# Patient Record
Sex: Male | Born: 1955 | Race: Black or African American | Hispanic: No | Marital: Married | Smoking: Never smoker
Health system: Southern US, Community
[De-identification: ages and names within clinical notes are randomized; demographics above are authoritative.]

## PROBLEM LIST (undated history)

## (undated) DIAGNOSIS — I10 Essential (primary) hypertension: Secondary | ICD-10-CM

## (undated) DIAGNOSIS — E785 Hyperlipidemia, unspecified: Secondary | ICD-10-CM

## (undated) DIAGNOSIS — K602 Anal fissure, unspecified: Secondary | ICD-10-CM

## (undated) DIAGNOSIS — R42 Dizziness and giddiness: Secondary | ICD-10-CM

## (undated) DIAGNOSIS — M4800 Spinal stenosis, site unspecified: Secondary | ICD-10-CM

## (undated) HISTORY — DX: Spinal stenosis, site unspecified: M48.00

## (undated) HISTORY — DX: Dizziness and giddiness: R42

## (undated) HISTORY — DX: Anal fissure, unspecified: K60.2

## (undated) HISTORY — DX: Essential (primary) hypertension: I10

## (undated) HISTORY — DX: Hyperlipidemia, unspecified: E78.5

---

## 2000-07-12 ENCOUNTER — Encounter: Admission: RE | Admit: 2000-07-12 | Discharge: 2000-07-12 | Payer: Self-pay | Admitting: Family Medicine

## 2000-07-12 ENCOUNTER — Encounter: Payer: Self-pay | Admitting: Family Medicine

## 2004-01-13 ENCOUNTER — Ambulatory Visit: Admission: RE | Admit: 2004-01-13 | Discharge: 2004-01-13 | Payer: Self-pay | Admitting: Family Medicine

## 2004-05-06 ENCOUNTER — Ambulatory Visit (HOSPITAL_COMMUNITY): Admission: RE | Admit: 2004-05-06 | Discharge: 2004-05-06 | Payer: Self-pay | Admitting: Family Medicine

## 2014-08-23 LAB — TSH: TSH: 2.4 u[IU]/mL (ref <=5.90)

## 2014-08-27 ENCOUNTER — Other Ambulatory Visit: Payer: Self-pay | Admitting: Family Medicine

## 2014-08-27 DIAGNOSIS — E041 Nontoxic single thyroid nodule: Secondary | ICD-10-CM

## 2014-08-29 ENCOUNTER — Ambulatory Visit
Admission: RE | Admit: 2014-08-29 | Discharge: 2014-08-29 | Disposition: A | Discharge status: Home / Self Care | Payer: BC Managed Care – PPO | Source: Ambulatory Visit | Attending: Family Medicine | Admitting: Family Medicine

## 2014-08-29 DIAGNOSIS — E041 Nontoxic single thyroid nodule: Secondary | ICD-10-CM

## 2014-09-30 ENCOUNTER — Encounter: Payer: Self-pay | Admitting: Internal Medicine

## 2014-09-30 ENCOUNTER — Ambulatory Visit (INDEPENDENT_AMBULATORY_CARE_PROVIDER_SITE_OTHER): Payer: BC Managed Care – PPO | Admitting: Internal Medicine

## 2014-09-30 ENCOUNTER — Telehealth: Payer: Self-pay | Admitting: Internal Medicine

## 2014-09-30 VITALS — BP 114/62 | HR 86 | Temp 97.6°F | Resp 12 | Ht 69.0 in | Wt 186.0 lb

## 2014-09-30 DIAGNOSIS — R221 Localized swelling, mass and lump, neck: Secondary | ICD-10-CM

## 2014-09-30 DIAGNOSIS — E042 Nontoxic multinodular goiter: Secondary | ICD-10-CM

## 2014-09-30 NOTE — Patient Instructions (Addendum)
Please schedule the CT scan of the neck.  Please return in 2 years.

## 2014-09-30 NOTE — Progress Notes (Addendum)
Patient ID: Joel BaconJohn L Manninen, male   DOB: Apr 25, 1956, 59 y.o.   MRN: [id number]   HPI  Joel Paul is a 59 y.o.-year-old male, referred by his PCP, Dr. Nolene EbbsWilliam Randall Harris, for evaluation and management of thyroid nodules.  Patient describes that he first noticed a lump in R upper neck, near his Adam's apple, 2 months ago >> saw his PCP then and a neck ultrasound was ordered. This showed several small thyroid nodules, but no abnormality in the right upper neck. He still notices this mass that moves with swallowing, but he does not believe that he grew in the interval.  Thyroid U/S (08/29/2014): Small nodules, the largest being 7 mm  I reviewed pt's thyroid tests: 08/23/2014: TSH 2.40, fT4 1.39, TT3 111 (71-180)  Pt denies feeling nodules in neck other than the one mentioned above, also denies hoarseness, dysphagia/odynophagia, SOB with lying down.  Pt denies: - heat intolerance/cold intolerance - tremors - palpitations - anxiety/depression - hyperdefecation/constipation - weight loss - weight gain - dry skin - hair falling - problems with concentration - fatigue  Pt does have a FH of thyroid ds >> hypothyroidism in sister. No FH of thyroid cancer. No h/o radiation tx to head or neck.  No seaweed or kelp, no recent contrast studies. No steroid use. No herbal supplements.   I reviewed his chart and he also has a history of HTN, HL.  ROS: Constitutional: no weight gain/loss, no fatigue, no subjective hyperthermia/hypothermia Eyes: no blurry vision, no xerophthalmia ENT: no sore throat, + see HPI Cardiovascular: no CP/SOB/palpitations/leg swelling Respiratory: no cough/SOB Gastrointestinal: no N/V/D/C Musculoskeletal: no muscle/joint aches Skin: no rashes Neurological: no tremors/numbness/tingling/dizziness Psychiatric: no depression/anxiety  Past Medical History  Diagnosis Date  . Hypertension   . Spinal stenosis   . Vertigo   . Anal fissure   . Hyperlipidemia     No past surgical history on file.   History   Social History  . Marital Status: Married    Spouse Name: N/A  . Number of Children: 1   Occupational History  .  insurance sales    Social History Main Topics  . Smoking status: Never Smoker   . Smokeless tobacco: Not on file  . Alcohol Use: No  . Drug Use: No   Social History Narrative   Married   1 child: Daughter in college   Owner: [employer]      Current Outpatient Rx  Name  Route  Sig  Dispense  Refill  . CARDIZEM LA 240 MG 24 hr tablet   Oral   Take 240 mg by mouth daily.       3     Dispense as written.   . triamterene-hydrochlorothiazide (DYAZIDE) 37.5-25 MG per capsule   Oral   Take 1 capsule by mouth daily. 1 tablet in the morning          Allergies  Allergen Reactions  . Codeine Nausea And Vomiting    Fainting   . Fentanyl Other (See Comments)    GI upset, unable to eat   Family history:  Mother and brother with hypertension and hyperlipidemia. Sister with hypothyroidism.  PE: BP 114/62 mmHg  Pulse 86  Temp(Src) 97.6 F (36.4 C) (Oral)  Resp 12  Ht 5\' 9"  (1.753 m)  Wt 186 lb (84.369 kg)  BMI 27.45 kg/m2  SpO2 98% Wt Readings from Last 3 Encounters:  09/30/14 186 lb (84.369 kg)   Constitutional: overweight, in NAD Eyes:  PERRLA, EOMI, no exophthalmos ENT: moist mucous membranes, no thyromegaly, no cervical lymphadenopathy; palpable approximately 1.5 cm mass on the right side, close to his Adam's apple Cardiovascular: RRR, No MRG Respiratory: CTA B Gastrointestinal: abdomen soft, NT, ND, BS+ Musculoskeletal: no deformities, strength intact in all 4;  Skin: moist, warm, no rashes Neurological: no tremor with outstretched hands, DTR normal in all 4  ASSESSMENT: 1. Multiple thyroid nodules - thyroid U/S (08/29/2014): Small nodules, the largest being 7 mm. No lymphadenopathy. No abnormality seen in the upper right neck.  2. Right upper neck nodule  PLAN: 1. MNG   - I reviewed the images of his thyroid ultrasound along with the patient. I pointed out that the dominant nodules are very small, with 2 slightly larger, 6 in the largest dimension in the right lobe.  they are without calcifications, without internal blood flow, more wide than tall, and well delimited from surrounding tissue. Pt does not have a thyroid cancer family history or a personal history of RxTx to head/neck. All these would favor benignity.  - I explained that these nodules are low risk based on the size in their characteristics, however, the ones in the right lobe are solid and hypoechoic and I suggested that we reimage them in 2 years  - I advised pt to  call me before our next appointment so I can order the new ultrasound and will review the images when he comes back  2. Right upper neck nodule - He definitely has a palpable mass in the right upper neck, in close proximity to his Adam's apple. This moves freely, with deglutition.  - No abnormality was found on the ultrasound, so we decided to obtain a CT scan of his neck to further characterize. - He was previously a singer - No problems with swallowing, pain, or other neck symptoms   Orders Placed This Encounter  Procedures  . CT Soft Tissue Neck W Contrast   I will addend the results when they become available.  10/19/2014: CLINICAL DATA: Patient complains of a lump in the RIGHT upper neck noticed 2 months ago, which moves with swallowing but has not enlarged in the interval. No reported tenderness  EXAM: CT NECK WITH CONTRAST  TECHNIQUE: Multidetector CT imaging of the neck was performed using the standard protocol following the bolus administration of intravenous contrast.  CONTRAST: 75mL OMNIPAQUE IOHEXOL 300 MG/ML SOLN  COMPARISON: Thyroid ultrasound 08/29/2014.  FINDINGS: Pharynx and larynx: Negative. No evidence for laryngocele.  Salivary glands: Negative  Thyroid: Unremarkable. Tiny nodules  noted on previous thyroid sonography are better visualized on that prior imaging study. No concerning features. No overall gland enlargement.  Lymph nodes: Negative  Vascular: Negative  Limited intracranial: Negative  Visualized orbits: Negative  Mastoids and visualized paranasal sinuses: Negative  Skeleton: Negative except for spondylosis  Upper chest: Negative.  Attention was directed to the area of clinical concern, location of which marked with a vitamin-E capsule. This corresponds to slight asymmetry of the strap muscles. There is a 3 x 5 mm cross-section area of relative hypoattenuation as seen on image 55 series 3. No concerning features such is associated inflammation or underlying abnormality of the thyroid cartilage. No evidence for enhancing rim, and no similar abnormality on the LEFT. Clinical significance uncertain. ENT consultation may be warranted.  IMPRESSION: No thyroid abnormality is detected.  The area of clinical concern, marked with a vitamin-E capsule, corresponds to slight asymmetric enlargement of the RIGHT strap muscle. There is a 3  x 5 mm area of relative hypoattenuation without rim enhancement or surrounding concerning features. Significance is uncertain.   Electronically Signed  By: Davonna BellingJohn Curnes M.D.  On: 10/19/2014 10:32

## 2014-10-02 NOTE — Telephone Encounter (Signed)
Error

## 2014-10-07 ENCOUNTER — Encounter: Payer: Self-pay | Admitting: Internal Medicine

## 2014-10-18 ENCOUNTER — Ambulatory Visit
Admission: RE | Admit: 2014-10-18 | Discharge: 2014-10-18 | Disposition: A | Discharge status: Home / Self Care | Payer: BC Managed Care – PPO | Source: Ambulatory Visit | Attending: Internal Medicine | Admitting: Internal Medicine

## 2014-10-18 MED ORDER — IOHEXOL 300 MG/ML  SOLN
75.0000 mL | Freq: Once | INTRAMUSCULAR | Status: AC | PRN
  Administered 2014-10-18: 75 mL via INTRAVENOUS

## 2014-10-22 ENCOUNTER — Telehealth: Payer: Self-pay | Admitting: Internal Medicine

## 2014-10-22 NOTE — Telephone Encounter (Signed)
Patient is calling for the result of her scan

## 2014-10-22 NOTE — Telephone Encounter (Signed)
I sent you a result message.

## 2014-10-22 NOTE — Telephone Encounter (Signed)
Please advise the results of pt's scan.

## 2014-10-23 NOTE — Addendum Note (Signed)
Addended by: Carlus PavlovGHERGHE, Jentzen Minasyan on: 10/23/2014 04:57 PM   Modules accepted: Level of Service

## 2016-07-03 IMAGING — US US SOFT TISSUE HEAD/NECK
1 series · 14 of 25 positions shown · non-contrast
Comparison: None.

CLINICAL DATA: Thyroid nodule.

EXAM:
THYROID ULTRASOUND
TECHNIQUE: Ultrasound examination of the thyroid gland and adjacent soft
tissues was performed.

[Series 1: us soft tissue head/neck · 0.05mm/px · 14 of 51 slices shown]
[im 1/51]
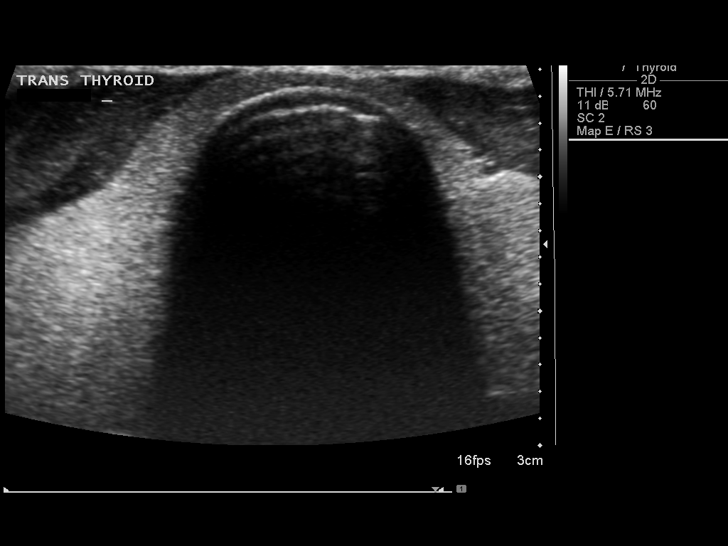
[im 5/51]
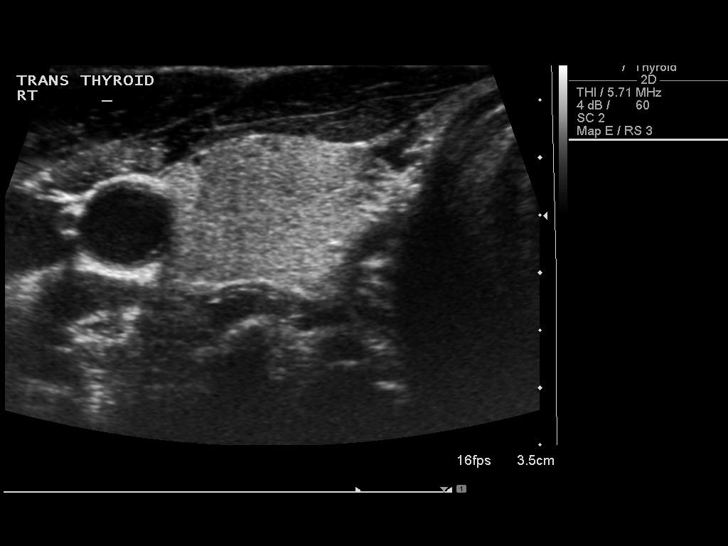
[im 9/51]
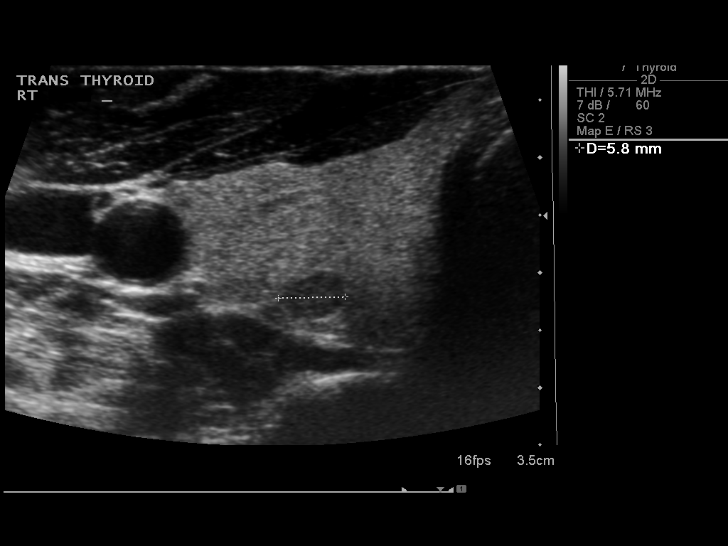
[im 13/51]
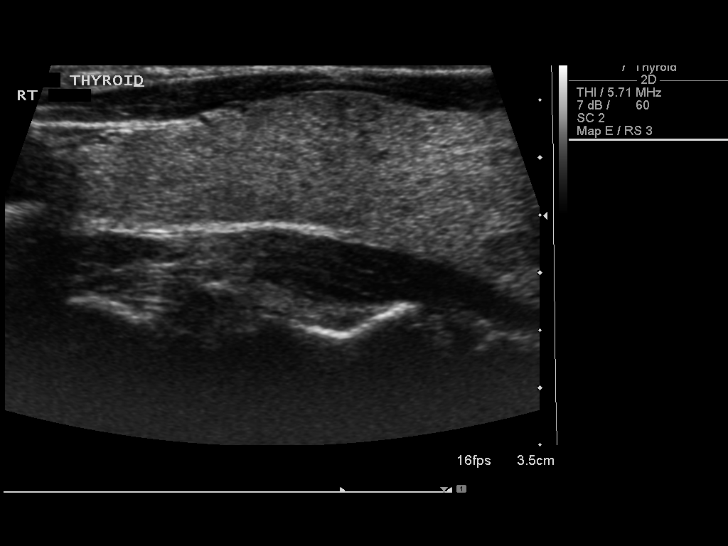
[im 17/51]
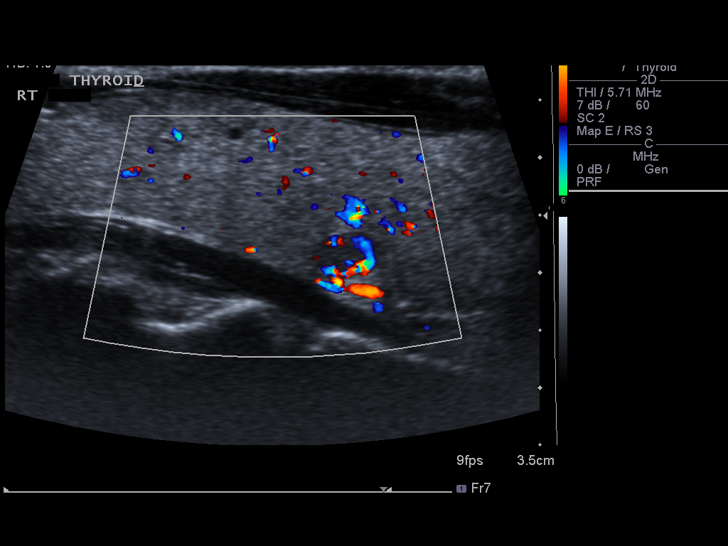
[im 19/51]
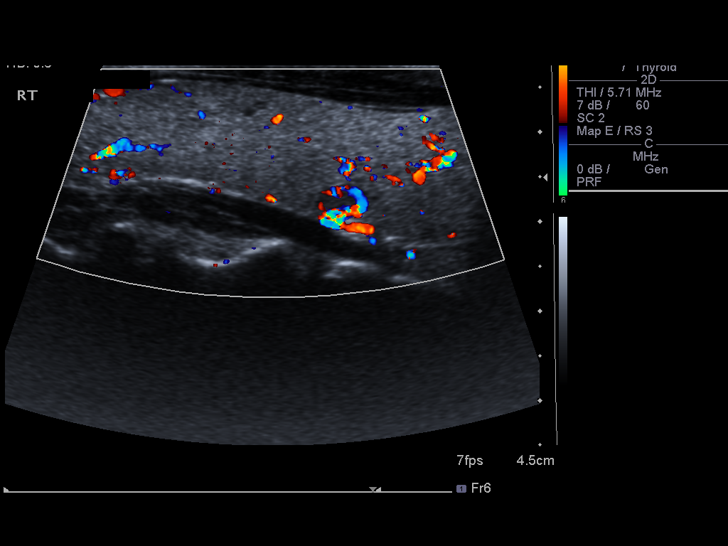
[im 23/51]
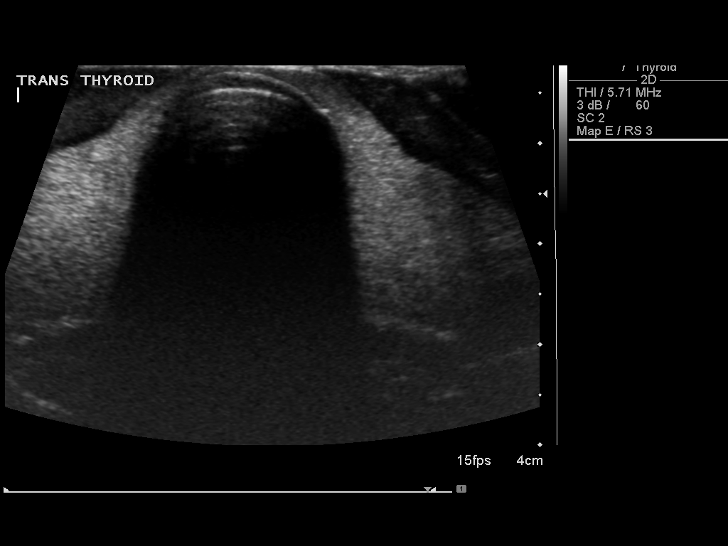
[im 28/51]
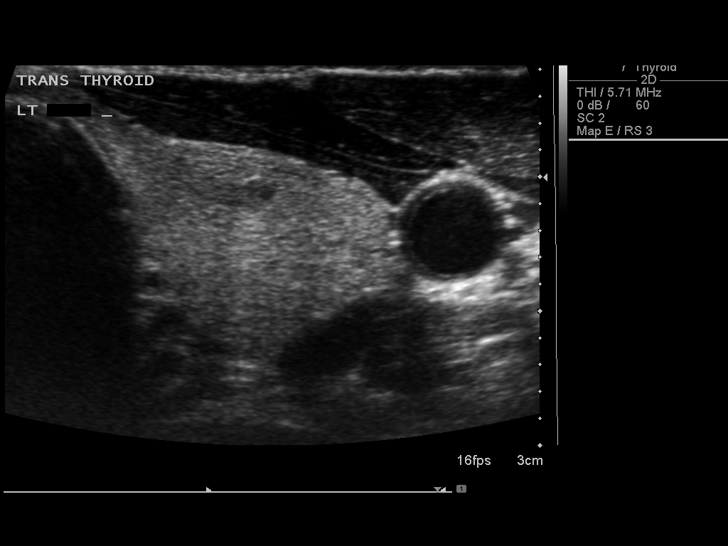
[im 32/51]
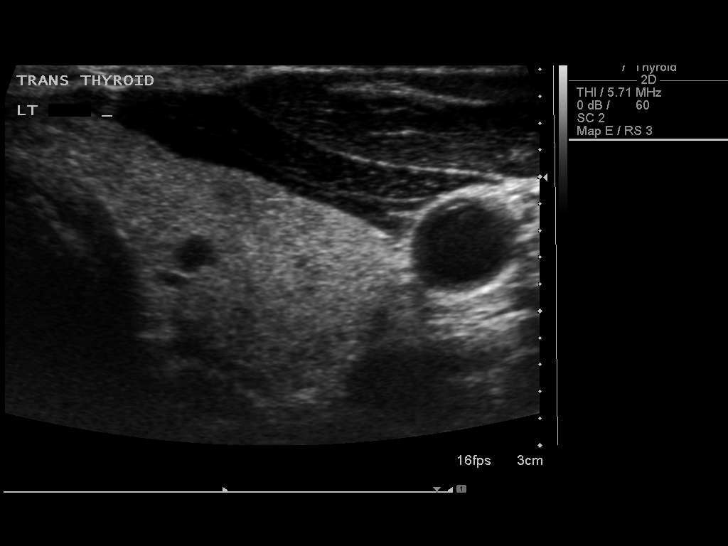
[im 34/51]
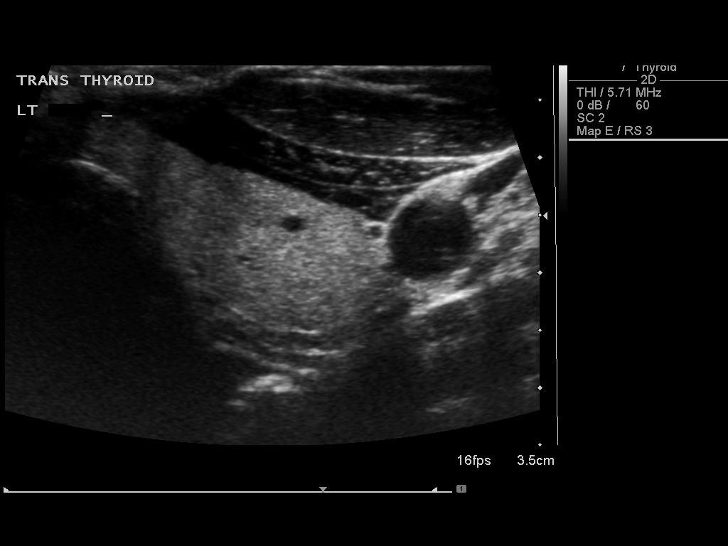
[im 38/51]
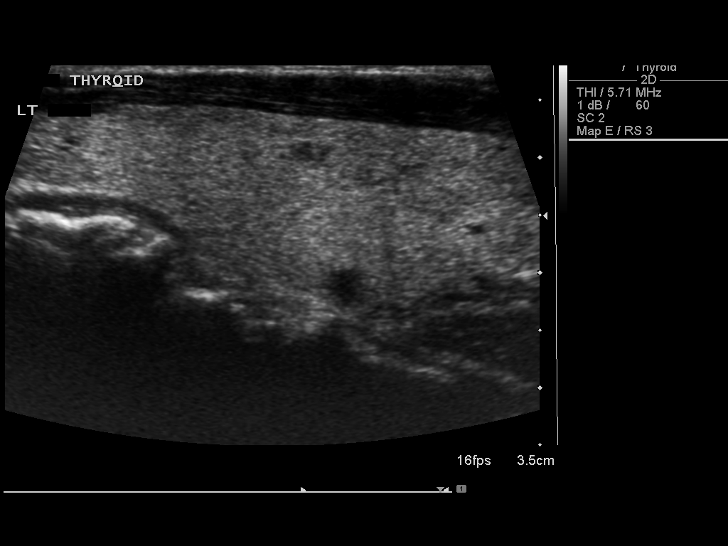
[im 42/51]
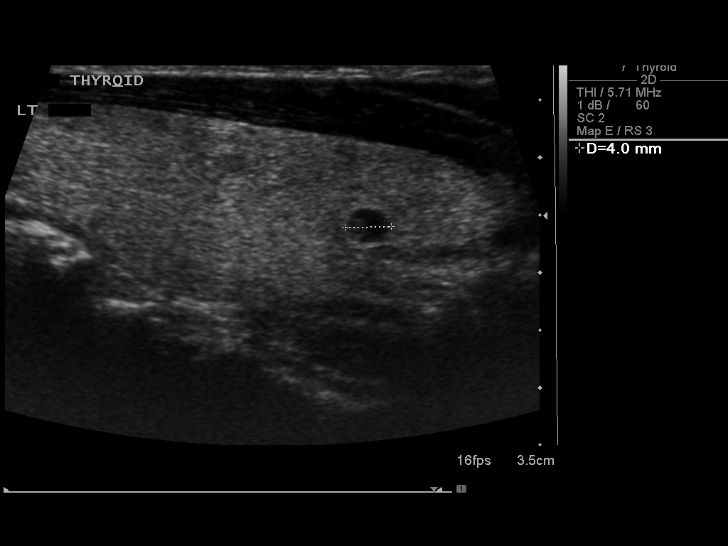
[im 46/51]
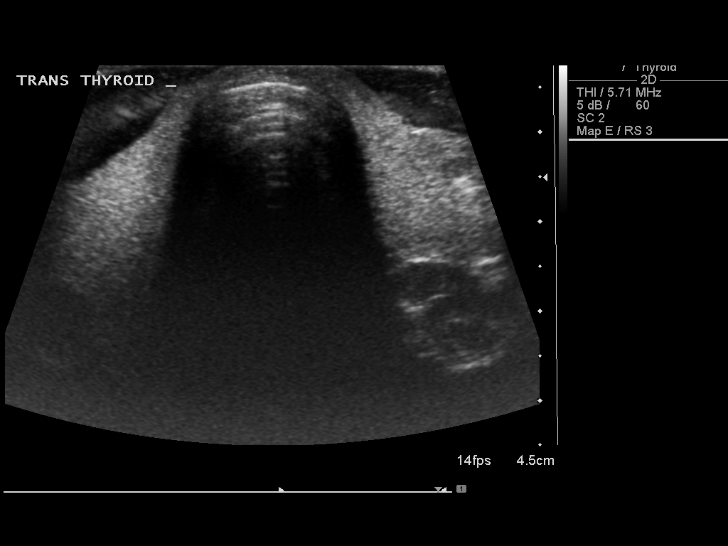
[im 51/51]
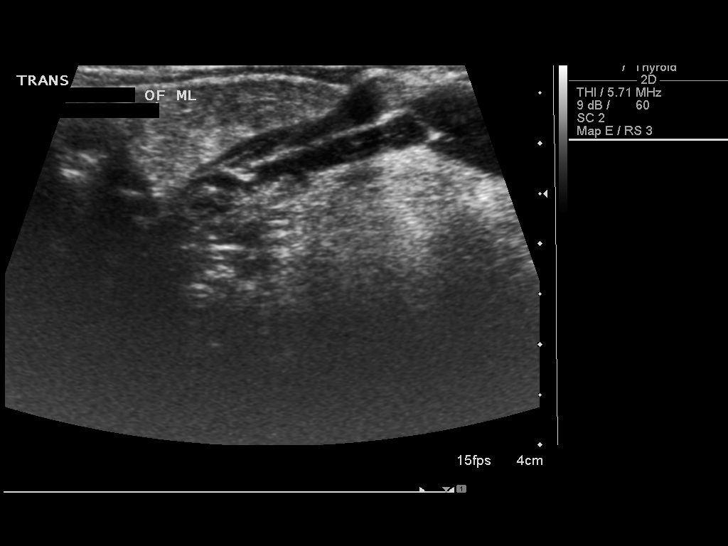

[14 of 25 positions shown; findings below may reference images not displayed]

FINDINGS: Right thyroid lobe

Measurements: 6.2 x 2.2 x 1.3 cm. 7 mm solid nodule is noted in
midpole.

Left thyroid lobe

Measurements: 5.8 x 2.1 x 1.1 cm. Multiple hypoechoic nodules are
noted, the largest measuring 4 mm.

Isthmus

Thickness: 2 mm.  No nodules visualized.

Lymphadenopathy

None visualized.
IMPRESSION: Small nodules are noted bilaterally. Findings do not meet current
SRU consensus criteria for biopsy. Follow-up by clinical exam is
recommended. If patient has known risk factors for thyroid
carcinoma, consider follow-up ultrasound in 12 months. If patient is
clinically hyperthyroid, consider nuclear medicine thyroid uptake
and scan.Reference: Management of Thyroid Nodules Detected at US:
Society of Radiologists in Ultrasound Consensus Conference

## 2016-08-22 IMAGING — CT CT NECK W/ CM
2 of 3 series · 8 of 14 positions shown, 9 images · IV contrast (omnipaque)
Comparison: Thyroid ultrasound 08/29/2014.

CLINICAL DATA: Patient complains of a lump in the RIGHT upper neck
noticed 2 months ago, which moves with swallowing but has not
enlarged in the interval. No reported tenderness

EXAM:
CT NECK WITH CONTRAST
TECHNIQUE: Multidetector CT imaging of the neck was performed using the
standard protocol following the bolus administration of intravenous
contrast.
CONTRAST:  75mL OMNIPAQUE IOHEXOL 300 MG/ML  SOLN

[Series 3: neck 3.0 b31s · axial · 0.43mm/px · z∈[+378,+549]mm · 4 of 96 slices shown]
[im 20/96  bone]
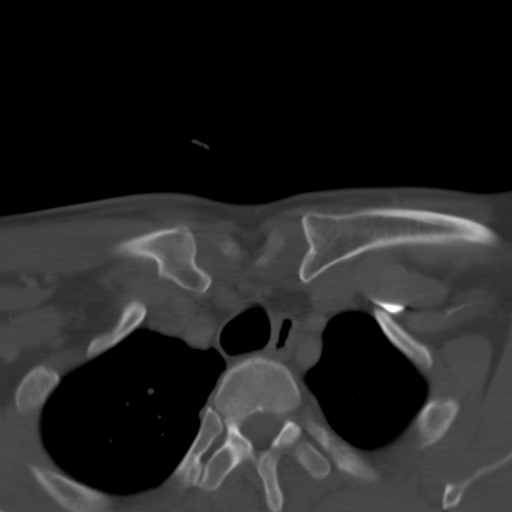
[im 39/96  bone]
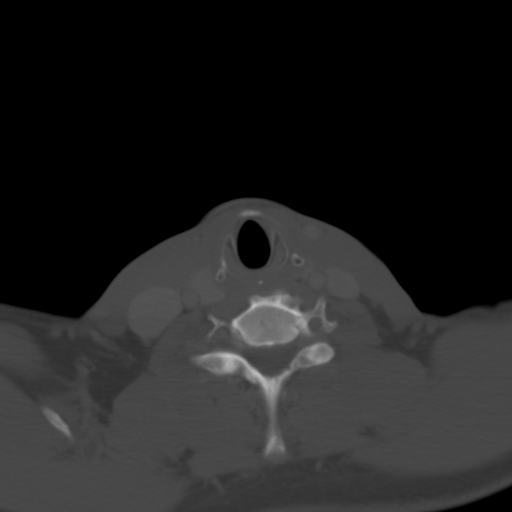
[im 58/96  bone]
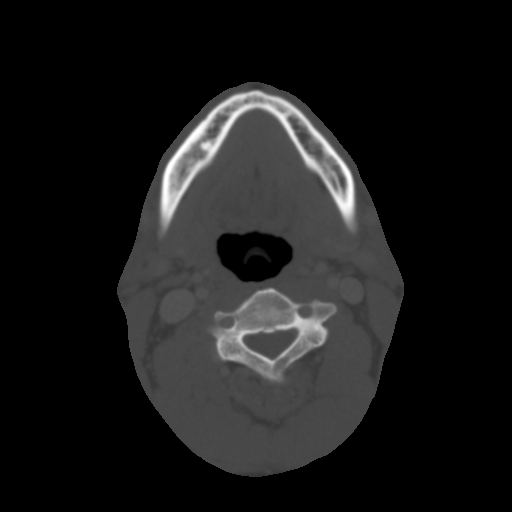
[im 77/96  bone]
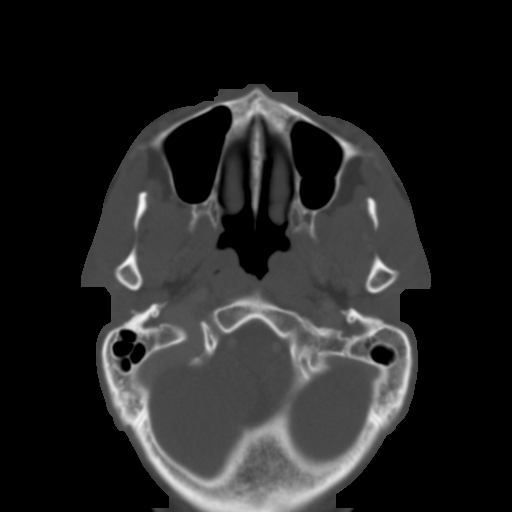

[Series 7: neck 3.0 axial · axial · 0.33mm/px · z∈[+355,+536]mm · 4 of 103 slices shown, 5 images]
[im 21/103  soft-tissue]
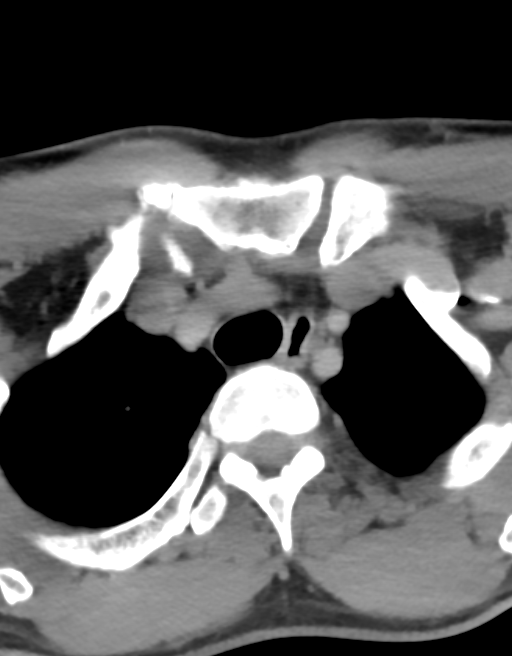
[im 21/103  bone]
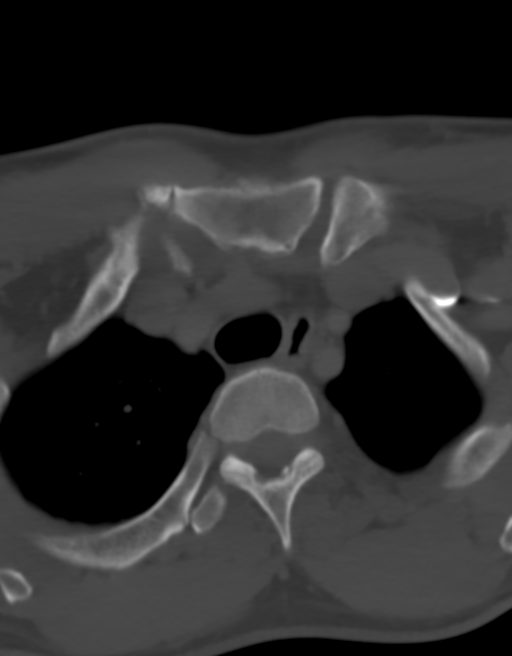
[im 41/103  bone]
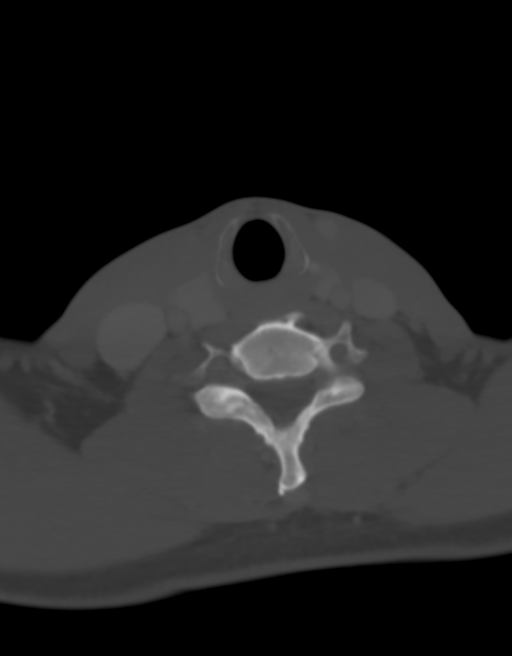
[im 62/103  bone]
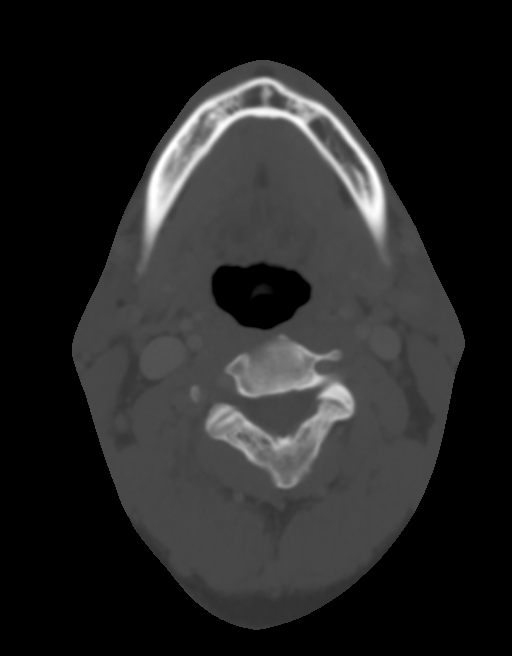
[im 82/103  bone]
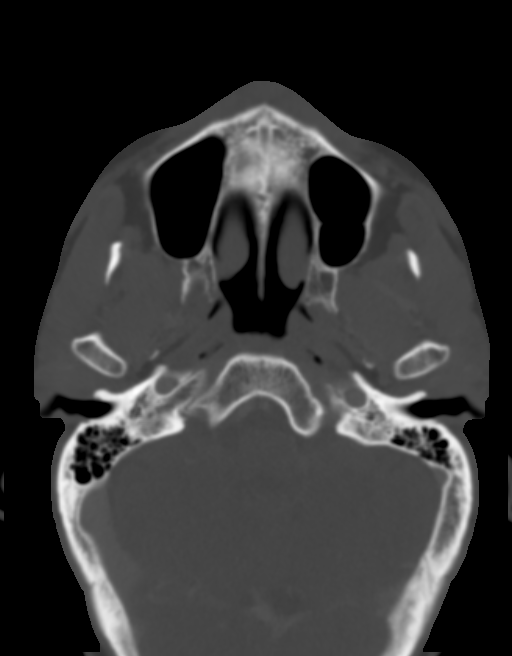

[8 of 14 positions shown; findings below may reference images not displayed]

FINDINGS: Pharynx and larynx: Negative.  No evidence for laryngocele.

Salivary glands: Negative

Thyroid: Unremarkable. Tiny nodules noted on previous thyroid
sonography are better visualized on that prior imaging study. No
concerning features. No overall gland enlargement.

Lymph nodes: Negative

Vascular: Negative

Limited intracranial: Negative

Visualized orbits: Negative

Mastoids and visualized paranasal sinuses: Negative

Skeleton: Negative except for spondylosis

Upper chest: Negative.

Attention was directed to the area of clinical concern, location of
which marked with a vitamin-E capsule. This corresponds to slight
asymmetry of the strap muscles. There is a 3 x 5 mm cross-section
area of relative hypoattenuation as seen on image 55 series 3. No
concerning features such is associated inflammation or underlying
abnormality of the thyroid cartilage. No evidence for enhancing rim,
and no similar abnormality on the LEFT. Clinical significance
uncertain. ENT consultation may be warranted.
IMPRESSION: No thyroid abnormality is detected.

The area of clinical concern, marked with a vitamin-E capsule,
corresponds to slight asymmetric enlargement of the RIGHT strap
muscle. There is a 3 x 5 mm area of relative hypoattenuation without
rim enhancement or surrounding concerning features. Significance is
uncertain.

## 2020-03-28 ENCOUNTER — Ambulatory Visit: Payer: BC Managed Care – PPO | Attending: Internal Medicine

## 2020-03-28 DIAGNOSIS — Z23 Encounter for immunization: Secondary | ICD-10-CM

## 2020-03-28 NOTE — Progress Notes (Signed)
   Covid-19 Vaccination [facility]  Name:  Joel Paul    MRN: [id number] DOB: August 09, 1955  03/28/2020  [name] was observed post Covid-19 immunization for 15 minutes without incident. He was provided with Vaccine Information Sheet and instruction to access the V-Safe system.  Vaccinated by [facility]   [name] was instructed to call 911 with any severe reactions post vaccine: Marland Kitchen Difficulty breathing  . Swelling of face and throat  . A fast heartbeat  . A bad rash all over body  . Dizziness and weakness

## 2020-04-03 ENCOUNTER — Other Ambulatory Visit (HOSPITAL_BASED_OUTPATIENT_CLINIC_OR_DEPARTMENT_OTHER): Payer: Self-pay | Admitting: Internal Medicine

## 2020-04-03 MED FILL — JANSSEN COVID-19 VACCINE 0.: 0.5 | 1 days supply | Qty: 1 | Fill #0
